# Patient Record
Sex: Male | Born: 1980 | Race: White | Hispanic: No | Marital: Single | Smoking: Never smoker
Health system: Southern US, Community
[De-identification: ages and names within clinical notes are randomized; demographics above are authoritative.]

## PROBLEM LIST (undated history)

## (undated) DIAGNOSIS — J349 Unspecified disorder of nose and nasal sinuses: Secondary | ICD-10-CM

## (undated) DIAGNOSIS — H9190 Unspecified hearing loss, unspecified ear: Secondary | ICD-10-CM

## (undated) DIAGNOSIS — I10 Essential (primary) hypertension: Secondary | ICD-10-CM

## (undated) HISTORY — DX: Unspecified disorder of nose and nasal sinuses: J34.9

## (undated) HISTORY — DX: Unspecified hearing loss, unspecified ear: H91.90

## (undated) HISTORY — DX: Essential (primary) hypertension: I10

---

## 2004-01-01 ENCOUNTER — Ambulatory Visit (HOSPITAL_COMMUNITY): Admission: RE | Admit: 2004-01-01 | Discharge: 2004-01-02 | Payer: Self-pay | Admitting: Neurosurgery

## 2004-02-11 ENCOUNTER — Encounter: Admission: RE | Admit: 2004-02-11 | Discharge: 2004-02-11 | Payer: Self-pay | Admitting: Neurosurgery

## 2004-03-24 ENCOUNTER — Encounter: Admission: RE | Admit: 2004-03-24 | Discharge: 2004-03-24 | Payer: Self-pay | Admitting: Neurosurgery

## 2005-07-19 IMAGING — CR DG LUMBAR SPINE 2-3V
3 series · 3 of 3 positions shown · non-contrast
Comparison: 02/11/04.

CLINICAL DATA: Post fusion.
 LUMBAR SPINE, 2-3 VIEWS:

[view not recorded (1 of 3)]
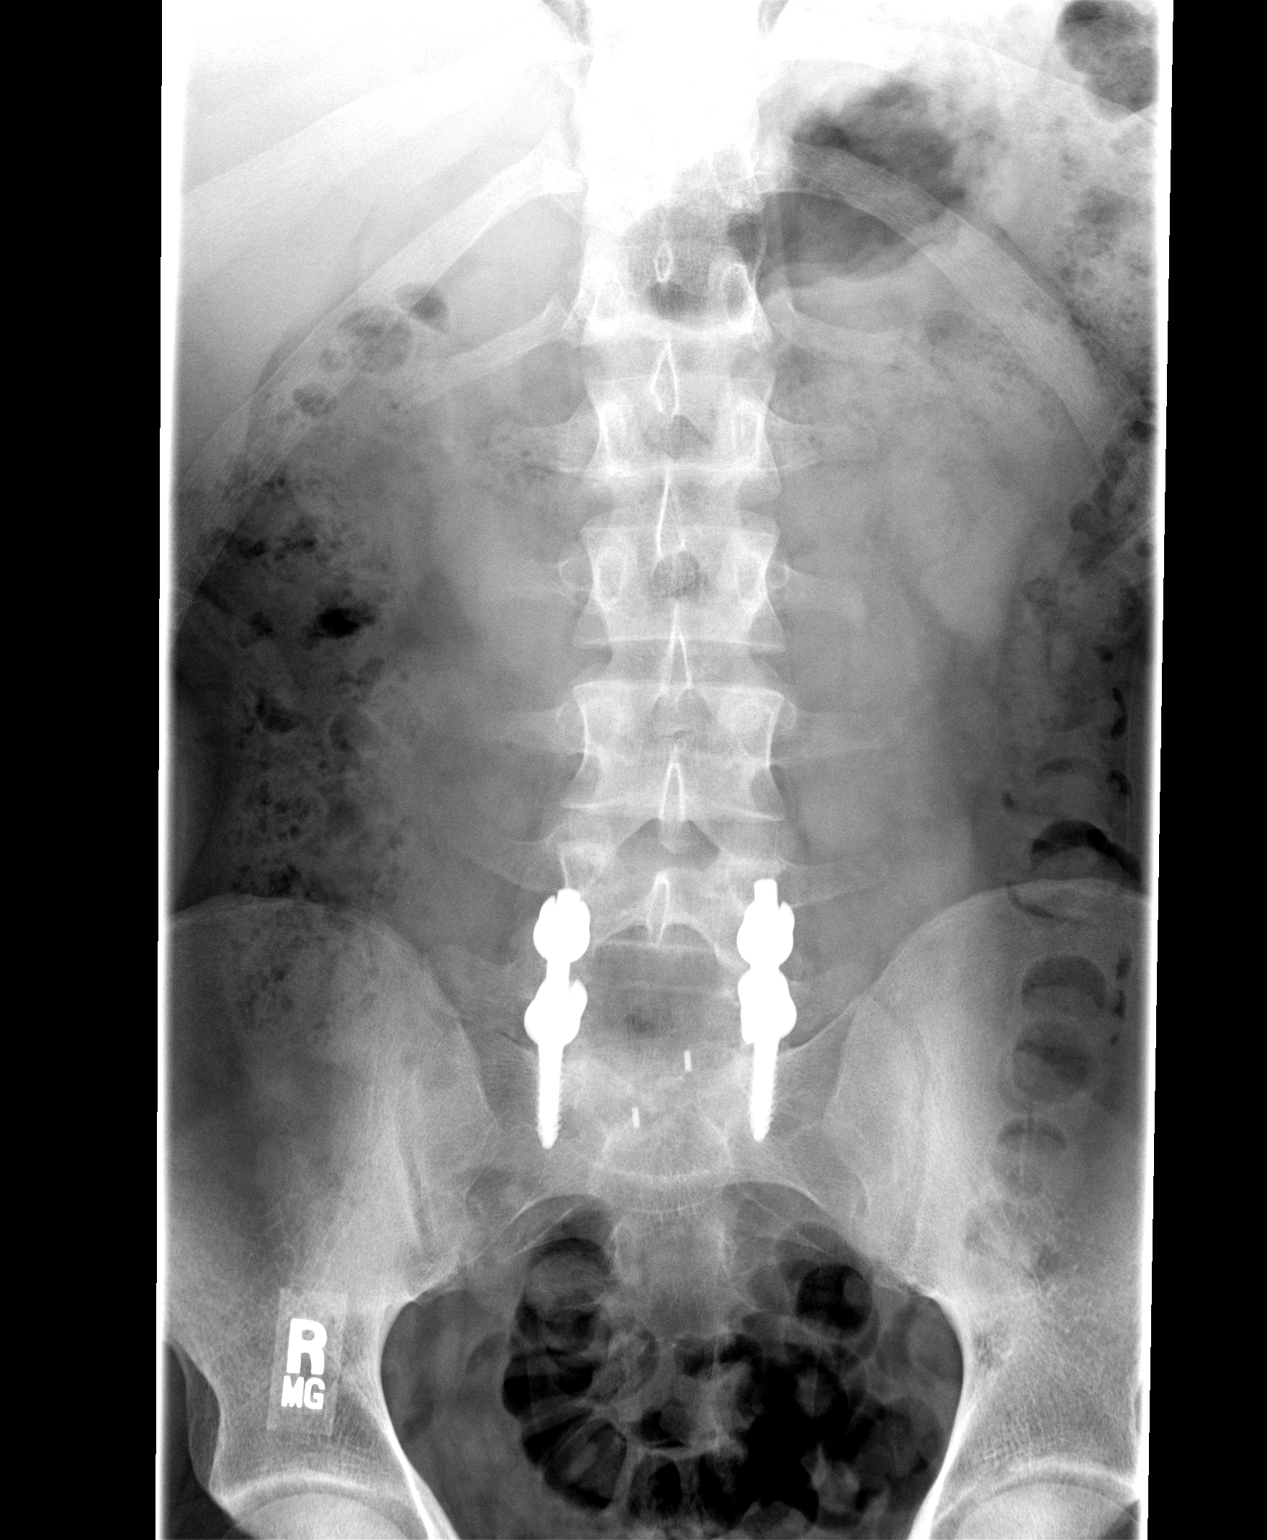

[view not recorded (2 of 3)]
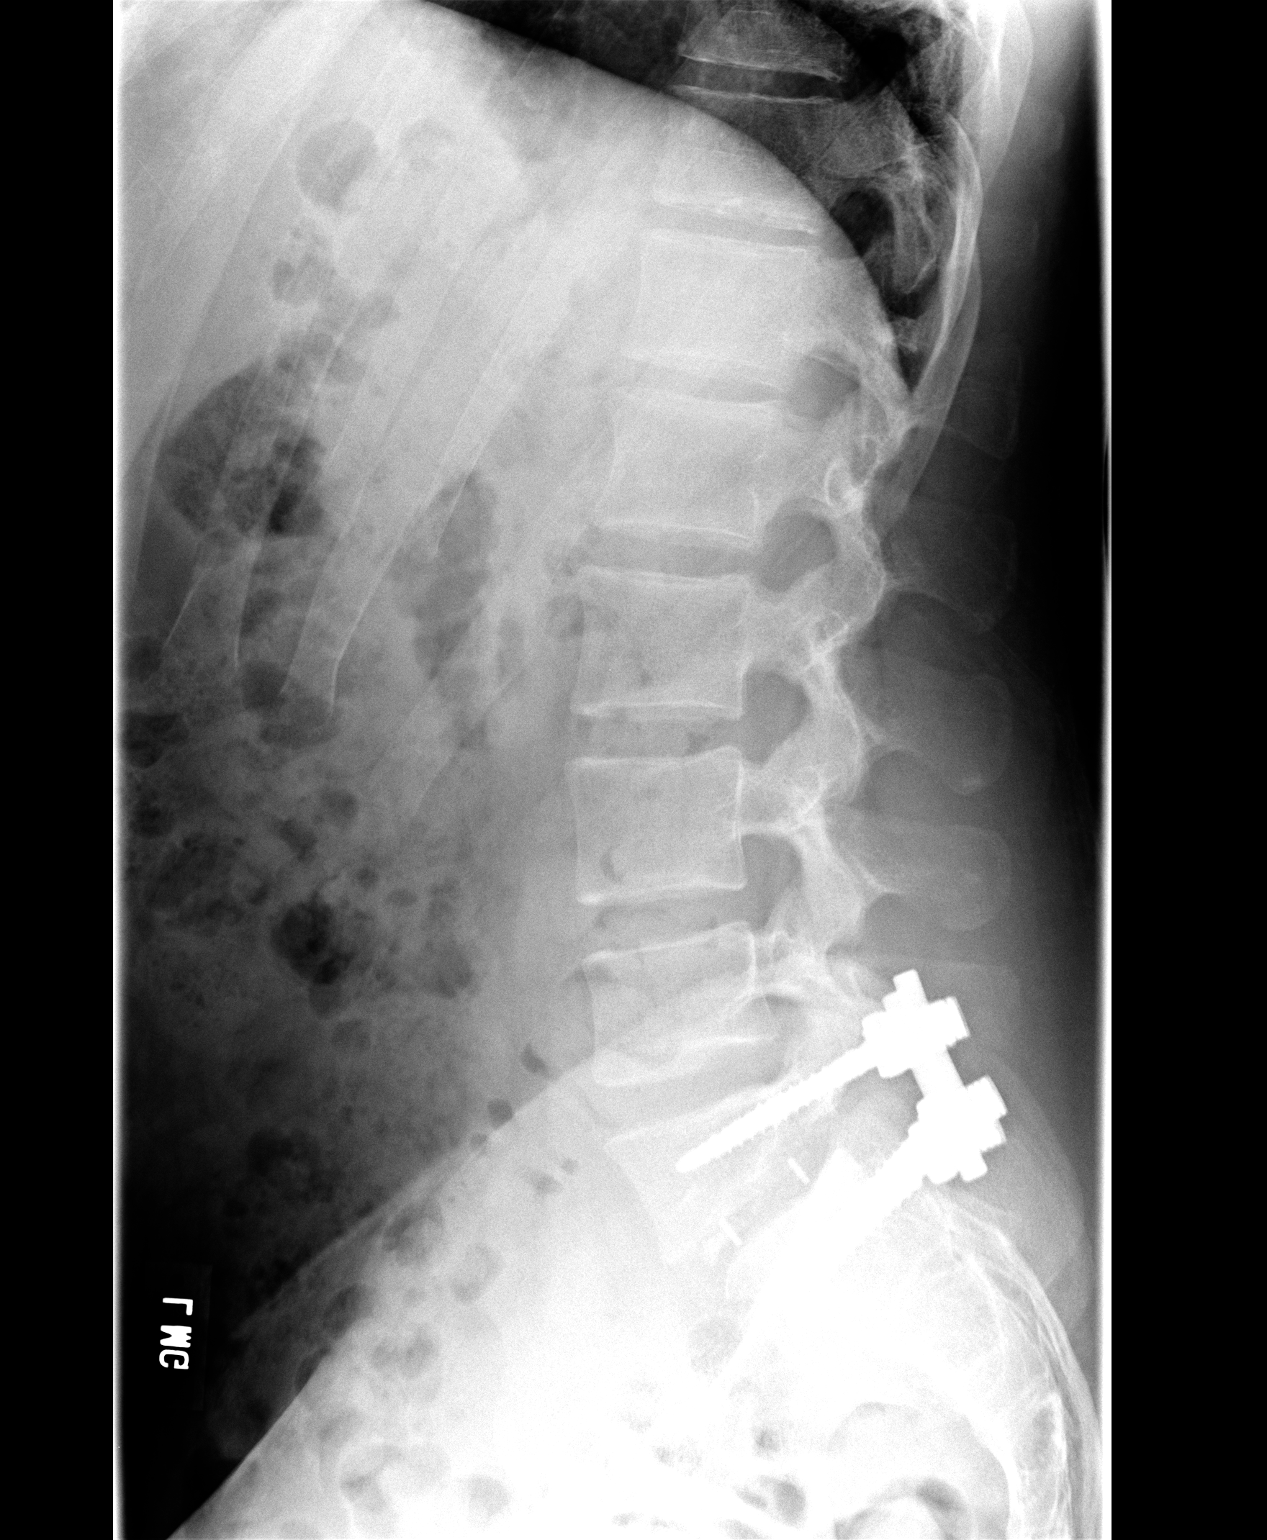

[view not recorded (3 of 3)]
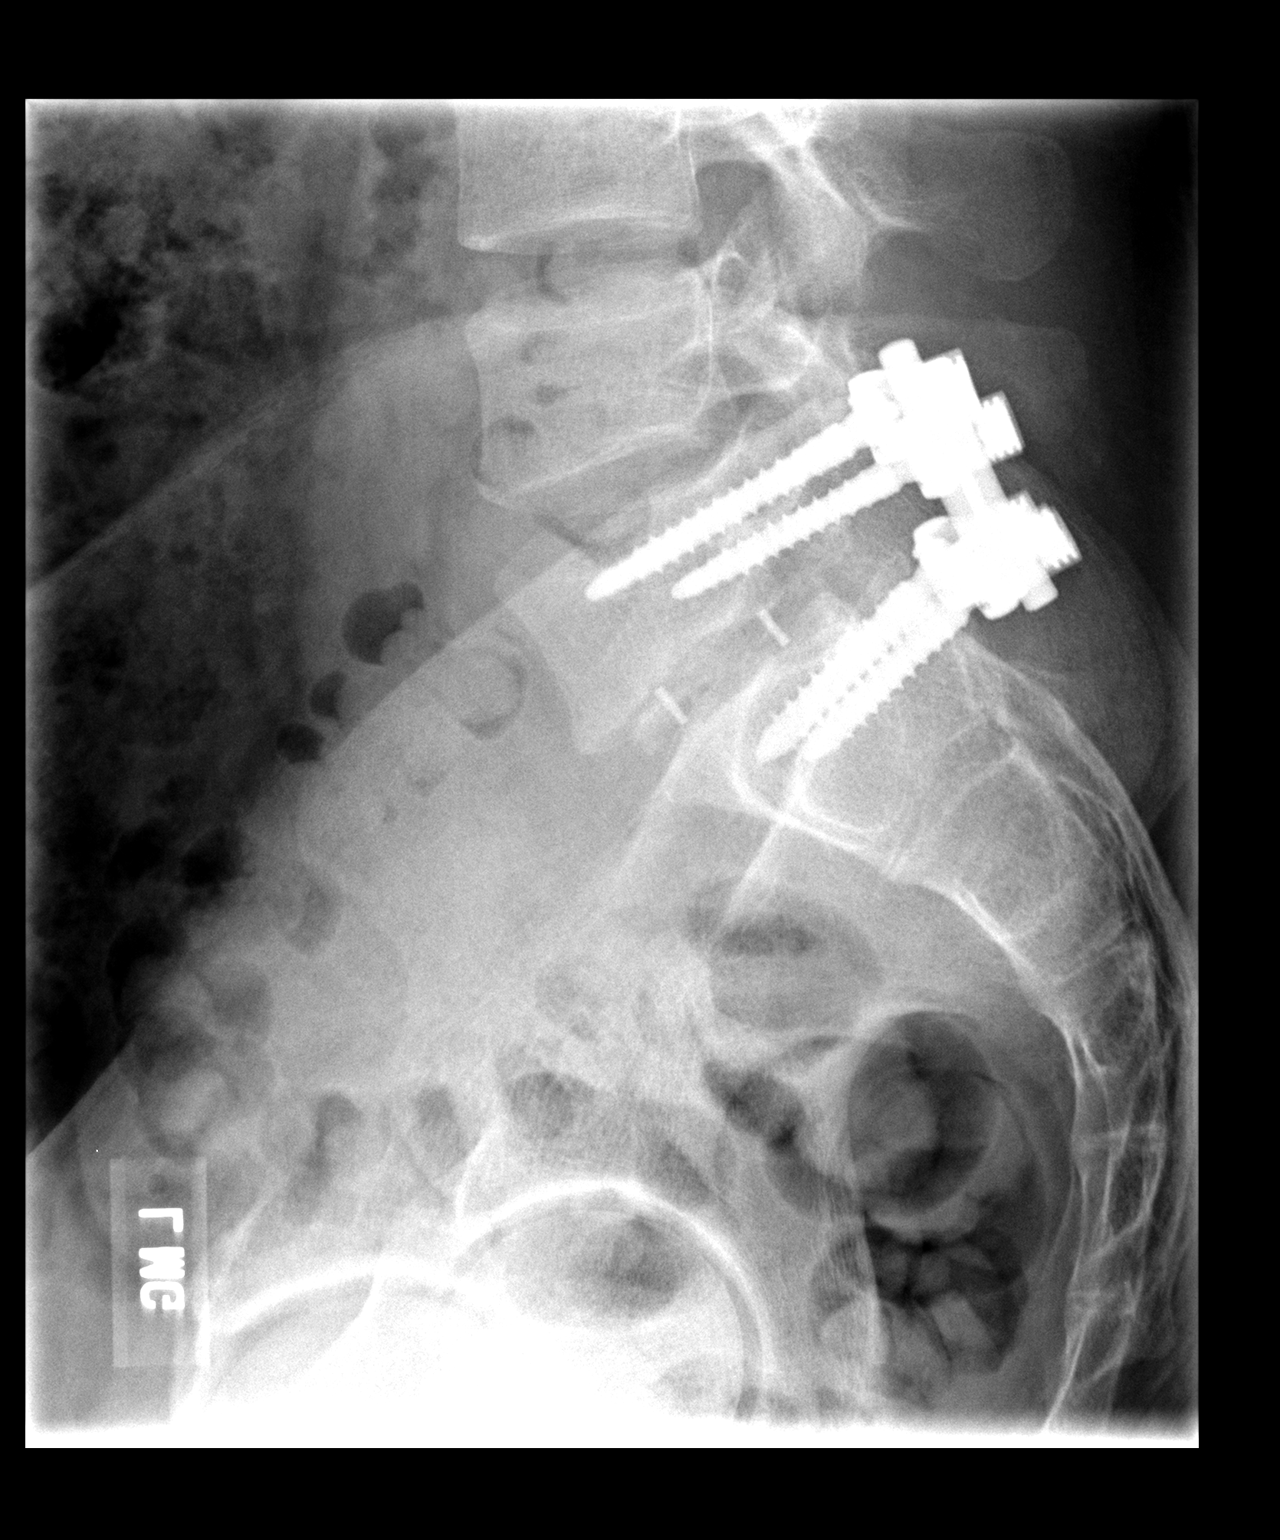

[3 of 3 positions shown; findings below may reference images not displayed]

FINDINGS: Status post L5-S1 fusion with rod and screws and interbody spacer.  There is about a 6-7 mm anterolisthesis of L5 on S1 which has not changed significantly.
 Other disk heights preserved.
IMPRESSION: Status post L5-S1 fusion with 6-7 mm anterolisthesis of L5 on S1 ? no significant change.

## 2009-03-29 ENCOUNTER — Observation Stay (HOSPITAL_COMMUNITY)
Admission: EM | Admit: 2009-03-29 | Discharge: 2009-03-30 | Payer: Self-pay | Source: Home / Self Care | Admitting: Emergency Medicine

## 2010-04-21 LAB — CBC
HCT: 42.5 % (ref 39.0–52.0)
Hemoglobin: 14.9 g/dL (ref 13.0–17.0)
MCHC: 35 g/dL (ref 30.0–36.0)
MCV: 96.8 fL (ref 78.0–100.0)
Platelets: 224 10*3/uL (ref 150–400)
RBC: 4.39 MIL/uL (ref 4.22–5.81)
RDW: 12.4 % (ref 11.5–15.5)
WBC: 9 10*3/uL (ref 4.0–10.5)

## 2010-06-18 NOTE — Op Note (Signed)
NAMEBRYSIN, Daniel Cunningham            ACCOUNT NO.:  [id number]   MEDICAL RECORD NO.:  [id number]          PATIENT TYPE:  OIB   LOCATION:  2856                         FACILITY:  [facility]   PHYSICIAN:  Reinaldo Meeker, M.D. DATE OF BIRTH:  08-14-1980   DATE OF PROCEDURE:  01/01/2004  DATE OF DISCHARGE:                                 OPERATIVE REPORT   PREOPERATIVE DIAGNOSIS:  Herniated disc and spondylosis, L5-S1, left.   POSTOPERATIVE DIAGNOSIS:  Herniated disc and spondylosis, L5-S1, left.   PROCEDURE:  Bilateral L5-S1 decompressive laminectomy followed by left L5-S1  microdiscectomy followed by left transverse lumbar interbody fusion with  PEEK interbody cage and autologous bone graft followed by pedicle screw  fixation.   SURGEON:  Reinaldo Meeker, M.D.   ASSISTANT:  Donalee Citrin, M.D.   PROCEDURE IN DETAIL:  After being placed in the prone position, the  patient's back was prepped and draped in the usual sterile fashion.  Localizing x-ray was taken prior to incision to identify the appropriate  level.  A midline incision was made above the spinous processes of L4, L5,  and S1.  Using Bovie cutting current, the incision was carried to the  spinous processes.  Subperiosteal dissection was then carried out  bilaterally on the spinous processes, lamina, and facet joints with extra  far lateral exposure on the left.  Self-retaining retractor was placed for  exposure and x-ray showed we approached the appropriate level.  The spinous  processes and interspinous ligament and free floating lamina of L5 were all  removed in a piecemeal fashion and the bone saved for use later in the case.  S1 nerve roots were identified bilaterally and tracked out to their foramen.  The pedicle of S1 was easily identified.  Dissection was carried out  superiorly bilaterally to identify the L5 nerve as it went around the  pedicle of L5.  Microdissection technique was then used to identify the L5-  S1 disc  which was found to be markedly herniated with particular extension  towards the left foramen and the L5 nerve root.  The disc was incised with a  15 blade and using pituitary rongeurs and curets, thorough disc space clean  out was carried out, care was taken that no injury to the neural elements  was done.  At this point, the interspace was prepared for transverse lumbar  interbody fusion cage.  The cage was packed with autologous bone and  scraping instruments were used to decorticate the endplate.  Autologous bone  graft was placed deep within the disc space prior to placing the cage.  The  cage was placed without difficulty and fluoroscopy showed it to be in good  position.  Pedicle screw instrumentation was carried out.  Through the small  pedicles of L5, 5.5 mm screws were used at this level.  6.5 mm screws were  used at S1.  They were placed in good position and confirmed with  fluoroscopy and then the locking mechanism was secured over the rod.  Fluoroscopy was carried out prior to locking the screws in the final  position.  Final  fluoroscopy in the AP and lateral direction showed the  cage, screws, and rod to all be in good position.  Large amounts of  irrigation was carried  out with any bleeding controlled by bipolar coagulation and Gelfoam.  The  wound was then closed using interrupted Vicryl in the muscle, fascia,  subcutaneous and subcuticular tissues, and staples on the skin.  Sterile  dressings were then applied and the patient was extubated and taken to the  recovery room in stable condition.       ROK/MEDQ  D:  01/01/2004  T:  01/01/2004  Job:  [id number]

## 2012-11-22 ENCOUNTER — Ambulatory Visit (INDEPENDENT_AMBULATORY_CARE_PROVIDER_SITE_OTHER): Payer: BC Managed Care – PPO

## 2012-11-22 ENCOUNTER — Encounter (INDEPENDENT_AMBULATORY_CARE_PROVIDER_SITE_OTHER): Payer: Self-pay

## 2012-11-22 VITALS — BP 110/74 | HR 78 | Resp 18

## 2012-11-22 DIAGNOSIS — M2010 Hallux valgus (acquired), unspecified foot: Secondary | ICD-10-CM

## 2012-11-22 DIAGNOSIS — M79609 Pain in unspecified limb: Secondary | ICD-10-CM

## 2012-11-22 DIAGNOSIS — M79672 Pain in left foot: Secondary | ICD-10-CM

## 2012-11-22 DIAGNOSIS — M2032 Hallux varus (acquired), left foot: Secondary | ICD-10-CM

## 2012-11-22 DIAGNOSIS — Q66229 Congenital metatarsus adductus, unspecified foot: Secondary | ICD-10-CM

## 2012-11-22 DIAGNOSIS — M2030 Hallux varus (acquired), unspecified foot: Secondary | ICD-10-CM

## 2012-11-22 DIAGNOSIS — M2012 Hallux valgus (acquired), left foot: Secondary | ICD-10-CM

## 2012-11-22 NOTE — Progress Notes (Signed)
Subjective:    Patient ID: Daniel Cunningham, male    DOB: 12-03-1980, 32 y.o.   MRN: [id number]  Foot Pain This is a new problem. The current episode started more than 1 month ago. The problem occurs constantly. The problem has been gradually worsening. Associated symptoms include neck pain and numbness. The symptoms are aggravated by standing and walking. He has tried immobilization for the symptoms. The treatment provided no relief.  big toe seems like it has fluid in it and the 2nd toe is laying over my big toe and some swelling and hurts to walk and feels better if it is elevated Patient continues to have pain which the great toe joint left foot first MTP area with lateral deviation of the hallux. Patient is adductovarus rotated third fourth and fifth digit with dorsal displacement of the second digit at the MTP joint and semirigid contracture at the IP joint of that second digit being noted.   Review of Systems  Constitutional: Negative.   HENT: Positive for sinus pressure.   Eyes: Negative.   Respiratory: Negative.   Cardiovascular: Negative.   Endocrine: Negative.   Genitourinary: Negative.   Musculoskeletal: Positive for back pain and neck pain.       Surgery on back  Skin: Negative.   Allergic/Immunologic: Negative.   Neurological: Positive for numbness.       Numbness in toe  Hematological: Negative.   Psychiatric/Behavioral: Negative.        Objective:   Physical Exam  Vitals reviewed. Constitutional: He is oriented to person, place, and time. He appears well-developed and well-nourished.  Cardiovascular:  Pulses:      Dorsalis pedis pulses are 2+ on the right side, and 2+ on the left side.       Posterior tibial pulses are 2+ on the right side, and 2+ on the left side.  Pedal pulses are palpable capillary refill time 3 seconds all digits bilateral. Skin temperature warm turgor normal. No edema or rubor noted. No varicosities noted.  Musculoskeletal:  Orthopedic  biomechanical exam reveals met adductus foot type bilateral with adductovarus rotation lesser digits 34 and 5. There is notable bunion deformity with lateral deviation of both hallux left being more severe than right and hammertoe deformity with semirigid digital contractures second digit left foot. X-rays confirm met adductus deformity as well as Cox abductovalgus. Abductus angle greater than 25. IM angle greater than 10 with met adductus angle greater than 30  Neurological: He is alert and oriented to person, place, and time. He has normal strength and normal reflexes.  Epicritic and proprioceptive sensations intact and symmetric bilateral. Normal plantar response and DTRs noted.  Skin: Skin is warm and dry. No cyanosis. Nails show no clubbing.  Skin color pigment and hair growth are normal. There is keratoses sub-fifth bilateral secondary slightly plantar flexed fifth metatarsal tailor bunion deformity. There is noted prominent bunion deformity bilateral with diffuse keratoses. Nails are normal.  Psychiatric: He has a normal mood and affect. His behavior is normal.          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 There is also hammertoe deformity with semirigid contractures second digit left foot. Plan at this time per patient request my recommendation is surgical intervention. Previously discussed at last visit and again possibly surgical intervention including Austin bunionectomy for correction of bunion and lateral deviation of the hallux as well as hammertoe repair second digit with pin fixation left foot. Risks  complications and alternatives of surgery were reviewed. All questions asked by the patient were answered. Consent forms reviewed and signed and surgery scheduled [facility]. Patient understands to be out of work for probably 6 to likely 8 week. He will be ambulatory postoperatively and air  fracture boot which is dispensed at this time. Patient is fitted for boot and instructed in use. Perioperative literature is dispensed and surgery scheduled next  Alvan Dame DPM

## 2012-11-22 NOTE — Patient Instructions (Signed)
Bunionectomy A bunionectomy is surgery to remove a bunion. A bunion is an enlargement of the joint at the base of the big toe. It is made up of bone and soft tissue on the inside part of the joint. Over time, a painful lump appears on the inside of the joint. The big toe begins to point inward toward the second toe. New bone growth can occur and a bone spur may form. The pain eventually causes difficulty walking. A bunion usually results from inflammation caused by the irritation of poorly fitting shoes. It often begins later in life. A bunionectomy is performed when nonsurgical treatment no longer works. When surgery is needed, the extent of the procedure will depend on the degree of deformity of the foot. Your surgeon will discuss with you the different procedures and what will work best for you depending on your age and health. LET YOUR CAREGIVER KNOW ABOUT:   Previous problems with anesthetics or medicines used to numb the skin.  Allergies to dyes, iodine, foods, and/or latex.  Medicines taken including herbs, eye drops, prescription medicines (especially medicines used to "thin the blood"), aspirin and other over-the-counter medicines, and steroids (by mouth or as a cream).  History of bleeding or blood problems.  Possibility of pregnancy, if this applies.  History of blood clots in your legs and/or lungs .  Previous surgery.  Other important health problems. RISKS AND COMPLICATIONS   Infection.  Pain.  Nerve damage.  Possibility that the bunion will recur. BEFORE THE PROCEDURE  You should be present 60 minutes prior to your procedure or as directed.  PROCEDURE  Surgery is often done so that you can go home the same day (outpatient). It may be done in a hospital or in an outpatient surgical center. An anesthetic will be used to help you sleep during the procedure. Sometimes, a spinal anesthetic is used to make you numb below the waist. A cut (incision) is made over the swollen  area at the first joint of the big toe. The enlarged lump will be removed. If there is a need to reposition the bones of the big toe, this may require more than 1 incision. The bone itself may need to be cut. Screws and wires may be used in the repair. These can be removed at a later date. In severe cases, the entire joint may need to be removed and a joint replacement inserted. When done, the incision is closed with stitches (sutures). Skin adhesive strips may be added for reinforcement. They help hold the incision closed.  AFTER THE PROCEDURE  Compression bandages (dressings) are then wrapped around the wound. This helps to keep the foot in alignment and reduce swelling. Your foot will be monitored for bleeding and swelling. You will need to stay for a few hours in the recovery area before being discharged. This allows time for the anesthesia to wear off. You will be discharged home when you are awake, stable, and doing well. HOME CARE INSTRUCTIONS   You can expect to return to normal activities within 6 to 8 weeks after surgery. The foot is at increased risk for swelling for several months. When you can expect to bear weight on the operated foot will depend on the extent of your surgery. The milder the deformity, the less tissue is removed and the sooner the return to normal activity level. During the recovery period, a special shoe, boot, or cast may be worn to accommodate the surgical bandage and to help provide stability   to the foot.  Once you are home, an ice pack applied to the operative site may help with discomfort and keep swelling down. Stop using the ice if it causes discomfort.  Keep your feet raised (elevated) when possible to lessen swelling.  If you have an elastic bandage on your foot and you have numbness, tingling, or your foot becomes cold and blue, adjust the bandage to make it comfortable.  Change dressings as directed.  Keep the wound dry and clean. The wound may be washed  gently with soap and water. Gently blot dry without rubbing. Do not take baths or use swimming pools or hot tubs for 10 days, or as instructed by your caregiver.  Only take over-the-counter or prescription medicines for pain, discomfort, or fever as directed by your caregiver.  You may continue a normal diet as directed.  For activity, use crutches with no weight bearing or your orthopedic shoe as directed. Continue to use crutches or a cane as directed until you can stand without causing pain. SEEK MEDICAL CARE IF:   You have redness, swelling, bruising, or increasing pain in the wound.  There is pus coming from the wound.  You have drainage from a wound lasting longer than 1 day.  You have an oral temperature above 102 F (38.9 C).  You notice a bad smell coming from the wound or dressing.  The wound breaks open after sutures have been removed.  You develop dizzy episodes or fainting while standing.  You have persistent nausea or vomiting.  Your toes become cold.  Pain is not relieved with medicines. SEEK IMMEDIATE MEDICAL CARE IF:   You develop a rash.  You have difficulty breathing.  You develop any reaction or side effects to medicines given.  Your toes are numb or blue, or you have severe pain. MAKE SURE YOU:   Understand these instructions.  Will watch your condition.  Will get help right away if you are not doing well or get worse. Document Released: 12/31/2004 Document Revised: 04/11/2011 Document Reviewed: 02/05/2007 [employer] Patient Information 2014 [employer].  

## 2012-12-31 DIAGNOSIS — M2040 Other hammer toe(s) (acquired), unspecified foot: Secondary | ICD-10-CM

## 2012-12-31 DIAGNOSIS — M2010 Hallux valgus (acquired), unspecified foot: Secondary | ICD-10-CM

## 2013-01-03 ENCOUNTER — Ambulatory Visit (INDEPENDENT_AMBULATORY_CARE_PROVIDER_SITE_OTHER): Payer: BC Managed Care – PPO

## 2013-01-03 VITALS — BP 119/84 | HR 96 | Resp 18

## 2013-01-03 DIAGNOSIS — M79609 Pain in unspecified limb: Secondary | ICD-10-CM

## 2013-01-03 DIAGNOSIS — M2040 Other hammer toe(s) (acquired), unspecified foot: Secondary | ICD-10-CM

## 2013-01-03 DIAGNOSIS — M2010 Hallux valgus (acquired), unspecified foot: Secondary | ICD-10-CM

## 2013-01-03 DIAGNOSIS — M2012 Hallux valgus (acquired), left foot: Secondary | ICD-10-CM

## 2013-01-03 DIAGNOSIS — Z09 Encounter for follow-up examination after completed treatment for conditions other than malignant neoplasm: Secondary | ICD-10-CM

## 2013-01-03 NOTE — Progress Notes (Signed)
   Subjective:    Patient ID: Daniel Cunningham, male    DOB: [birth year], 32 y.o.   MRN: [id number]  HPI Patient called Tuesday 01/01/13 and started that he was sick from the pain medicine and I stated to just take tylenol or advil and if needed to take boot off and the ace bandage and to call if needed and the patient said that the foot feels better if the foot is hanging down and not laying down which patient states that it gets hot and throbs more    Review of Systems no changes     Objective:   Physical Exam Neurovascular status is intact left foot. Pedal pulses palpable. Dressings intact and dry patient ambulating with the air fracture boot as instructed. On removal dressings incision is well coapted incisions clean dry slight ecchymosis the forefoot and digits mild edema noted consistent with postop course. No ascending cellulitis lymphangitis no dehiscence noted. Quickly good position of the hallux and second digit with intact pin fixation. X-rays reveal good alignment of the osteotomies with screw fixation first pin fixation second toe       Assessment & Plan:  The postop progress noted contain ice and elevation possible moderate walking activities tibial 5-10 posse to 15 minutes per hour for walking. Maintain a flat or level foot did not try to hold his toes up to that fit against the bandages or Cam Walker. Patient reappointed one week plan for dressing change posse 14 days consider suture removal. Maintain air fracture boot for least 5 more weeks.  Alvan Dame DPM

## 2013-01-03 NOTE — Patient Instructions (Signed)

## 2013-01-10 ENCOUNTER — Ambulatory Visit (INDEPENDENT_AMBULATORY_CARE_PROVIDER_SITE_OTHER): Payer: BC Managed Care – PPO

## 2013-01-10 VITALS — BP 120/83 | HR 98 | Resp 18

## 2013-01-10 DIAGNOSIS — M2040 Other hammer toe(s) (acquired), unspecified foot: Secondary | ICD-10-CM

## 2013-01-10 DIAGNOSIS — M2010 Hallux valgus (acquired), unspecified foot: Secondary | ICD-10-CM

## 2013-01-10 DIAGNOSIS — Z09 Encounter for follow-up examination after completed treatment for conditions other than malignant neoplasm: Secondary | ICD-10-CM

## 2013-01-10 DIAGNOSIS — M2012 Hallux valgus (acquired), left foot: Secondary | ICD-10-CM

## 2013-01-10 NOTE — Progress Notes (Signed)
   Subjective:    Patient ID: Daniel Cunningham, male    DOB: 12-20-1980, 32 y.o.   MRN: [id number]  HPI I am a lot better on my left foot    Review of Systems no changes the     Objective:   Physical Exam Neurovascular status is intact mild ecchymosis of the toes hallux second third and fourth toes consistent with postop course. Temperature warm turgor normal mild edema noted consistent with postop course. Incisions clean dry well coapted dressings intact and dry. There is still some tension due to severe contractures had suggest maintaining sutures for one more week dry sterile compressive dressing reapplied at this time maintain air fracture boot as instructed. Elevate and use ice as needed       Assessment & Plan:  Good postop progress noted minimal pain or discomfort noted mild edema and ecchymosis noted maintain ice and Tylenol or Advil as needed for pain followup in one week plan for suture removal second toe afterwards may resume normal bathing and hygiene. Within 4 weeks will likely be ready to have K wire removal and followup x-rays.  Alvan Dame DPM

## 2013-01-10 NOTE — Patient Instructions (Signed)

## 2013-01-17 ENCOUNTER — Ambulatory Visit (INDEPENDENT_AMBULATORY_CARE_PROVIDER_SITE_OTHER): Payer: BC Managed Care – PPO

## 2013-01-17 VITALS — BP 109/73 | HR 89 | Resp 18

## 2013-01-17 DIAGNOSIS — M2010 Hallux valgus (acquired), unspecified foot: Secondary | ICD-10-CM

## 2013-01-17 DIAGNOSIS — R609 Edema, unspecified: Secondary | ICD-10-CM

## 2013-01-17 DIAGNOSIS — M2012 Hallux valgus (acquired), left foot: Secondary | ICD-10-CM

## 2013-01-17 DIAGNOSIS — Z09 Encounter for follow-up examination after completed treatment for conditions other than malignant neoplasm: Secondary | ICD-10-CM

## 2013-01-17 DIAGNOSIS — M2040 Other hammer toe(s) (acquired), unspecified foot: Secondary | ICD-10-CM

## 2013-01-17 NOTE — Progress Notes (Signed)
   Subjective:    Patient ID: Daniel Cunningham, male    DOB: [birth year], 32 y.o.   MRN: [id number]  HPI I had surgery on 12/31/12 and it is doing good    Review of Systems deferred     Objective:   Physical Exam Neurovascular status intact pulses palpable mild ecchymosis and edema noted. Sutures intact and dry are removed the second toe at this time. K wires left intact. No signs of infection no increased temperature no discharge or drainage no dehiscence      Assessment & Plan:  Assessment good postop progress. Good alignment of the hallux and digits are noted at this time will maintain Ace anklet which is dispensed Coflex wrap in the second digit is provided for patient to maintain daily after bathing reappointed in 3-4 weeks for followup x-ray and reevaluation at that time maybe we'll discontinue boot  Alvan Dame DPM

## 2013-01-17 NOTE — Patient Instructions (Signed)

## 2013-02-04 NOTE — Progress Notes (Signed)
1) Austin Bunionectomy left foot with screw 2) Hammer toe repair 2nd toe with pin fixation left foot

## 2013-02-14 ENCOUNTER — Ambulatory Visit (INDEPENDENT_AMBULATORY_CARE_PROVIDER_SITE_OTHER): Payer: BC Managed Care – PPO

## 2013-02-14 VITALS — BP 118/82 | HR 91 | Resp 18

## 2013-02-14 DIAGNOSIS — Z09 Encounter for follow-up examination after completed treatment for conditions other than malignant neoplasm: Secondary | ICD-10-CM

## 2013-02-14 DIAGNOSIS — Q66229 Congenital metatarsus adductus, unspecified foot: Secondary | ICD-10-CM

## 2013-02-14 DIAGNOSIS — M2010 Hallux valgus (acquired), unspecified foot: Secondary | ICD-10-CM

## 2013-02-14 NOTE — Patient Instructions (Signed)
ICE INSTRUCTIONS  Apply ice or cold pack to the affected area at least 3 times a day for 10-15 minutes each time.  You should also use ice after prolonged activity or vigorous exercise.  Do not apply ice longer than 20 minutes at one time.  Always keep a cloth between your skin and the ice pack to prevent burns.  Being consistent and following these instructions will help control your symptoms.  We suggest you purchase a gel ice pack because they are reusable and do bit leak.  Some of them are designed to wrap around the area.  Use the method that works best for you.  Here are some other suggestions for icing.   Use a frozen bag of peas or corn-inexpensive and molds well to your body, usually stays frozen for 10 to 20 minutes.  Wet a towel with cold water and squeeze out the excess until it's damp.  Place in a bag in the freezer for 20 minutes. Then remove and use.  Also recommended daily stretching exercises and range of motion move the great toe joint up and down as much as a Hunter toe pushup today to help loosen up the great toe joint left foot. Also suggest maintain Coflex wrap in of the toe buddy splinting second third and fourth toes with the blue Coflex wrap. Use a Darco toe splints and bunion splint system every evening while sleeping help stretch the toe joints and reduce the contracture of the first and second toes left foot  Followup in 2 months for further postop evaluation May return to all work activities beginning 03/03/2013

## 2013-02-14 NOTE — Progress Notes (Signed)
   Subjective:    Patient ID: Carolin CoyJustin R Cissell, male    DOB: 10/16/1980, 33 y.o.   MRN: [id number]  HPI I am doing good on my left foot patient approxi-6 more weeks status post Austin bunionectomy left foot as well as hammertoe repair second digit left foot. X-rays reveal good position of the osteotomy intact fixations. Incision well coapted.    Review of Systems no new changes or findings     Objective:   Physical Exam Neurovascular status is intact pedal pulses palpable epicritic and proprioceptive sensations intact there is also contracted dorsally hallux and second digit dispense a Darco toe splint systems use at night Coflex wrap been buddy wrap the second digit to the third and fourth daily at this time x-rays reveal good position the osteotomy and fixation talar fixation still present second left. This time the K wires removed Neosporin and Band-Aid and Coflex wrap in applied. Maintain Darco toe splint every evening do daily toe exercises with stretching and range of motion of the great toe joint left reappointed 2 months for long-term followup       Assessment & Plan:  Assessment good postop progress good range of motion although still since dorsal displacement noted at the MTP joints maintain toe splint and buddy wrap and system recheck in 2 months for long-term postop followup  Alvan Dameichard Cheng Dec DPM
